# Patient Record
Sex: Female | Born: 1995 | Race: Black or African American | Hispanic: No | Marital: Single
Health system: Southern US, Community
[De-identification: ages and names within clinical notes are randomized; demographics above are authoritative.]

## PROBLEM LIST (undated history)

## (undated) DIAGNOSIS — Z800 Family history of malignant neoplasm of digestive organs: Secondary | ICD-10-CM

## (undated) DIAGNOSIS — Z789 Other specified health status: Secondary | ICD-10-CM

## (undated) DIAGNOSIS — Z803 Family history of malignant neoplasm of breast: Secondary | ICD-10-CM

## (undated) DIAGNOSIS — Z806 Family history of leukemia: Secondary | ICD-10-CM

## (undated) DIAGNOSIS — Z8042 Family history of malignant neoplasm of prostate: Secondary | ICD-10-CM

## (undated) HISTORY — DX: Family history of leukemia: Z80.6

## (undated) HISTORY — DX: Family history of malignant neoplasm of breast: Z80.3

## (undated) HISTORY — DX: Family history of malignant neoplasm of prostate: Z80.42

## (undated) HISTORY — DX: Family history of malignant neoplasm of digestive organs: Z80.0

---
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
## 2019-09-30 ENCOUNTER — Other Ambulatory Visit: Payer: Self-pay

## 2019-09-30 DIAGNOSIS — Z20822 Contact with and (suspected) exposure to covid-19: Secondary | ICD-10-CM

## 2019-10-02 LAB — NOVEL CORONAVIRUS, NAA: SARS-CoV-2, NAA: NOT DETECTED

## 2020-07-10 ENCOUNTER — Other Ambulatory Visit: Payer: Self-pay | Admitting: Sleep Medicine

## 2020-07-10 ENCOUNTER — Other Ambulatory Visit: Payer: Self-pay

## 2020-07-10 DIAGNOSIS — I471 Supraventricular tachycardia, unspecified: Secondary | ICD-10-CM

## 2020-07-11 LAB — NOVEL CORONAVIRUS, NAA: SARS-CoV-2, NAA: NOT DETECTED

## 2021-02-20 ENCOUNTER — Telehealth: Payer: Self-pay | Admitting: Genetic Counselor

## 2021-02-20 NOTE — Telephone Encounter (Signed)
[facility]. Pt has been cld and scheduled to see Cari on 4/25 at 1pm. Pt aware to arrive 15 minutes early.

## 2021-02-25 ENCOUNTER — Telehealth: Payer: Self-pay | Admitting: Genetic Counselor

## 2021-02-25 NOTE — Telephone Encounter (Signed)
Pt cld to reschedule her genetic counseling appt to 5/10 at 2pm.

## 2021-03-03 ENCOUNTER — Encounter: Payer: Self-pay | Admitting: Genetic Counselor

## 2021-03-03 ENCOUNTER — Other Ambulatory Visit: Payer: Self-pay

## 2021-03-18 ENCOUNTER — Other Ambulatory Visit: Payer: Self-pay

## 2021-03-18 ENCOUNTER — Inpatient Hospital Stay: Payer: Self-pay

## 2021-03-18 ENCOUNTER — Inpatient Hospital Stay: Payer: Self-pay | Attending: Genetic Counselor | Admitting: Genetic Counselor

## 2021-03-18 DIAGNOSIS — Z800 Family history of malignant neoplasm of digestive organs: Secondary | ICD-10-CM

## 2021-03-18 DIAGNOSIS — Z806 Family history of leukemia: Secondary | ICD-10-CM

## 2021-03-18 DIAGNOSIS — Z8042 Family history of malignant neoplasm of prostate: Secondary | ICD-10-CM

## 2021-03-18 DIAGNOSIS — Z803 Family history of malignant neoplasm of breast: Secondary | ICD-10-CM

## 2021-03-18 LAB — GENETIC SCREENING ORDER

## 2021-03-24 ENCOUNTER — Encounter: Payer: Self-pay | Admitting: Genetic Counselor

## 2021-03-24 DIAGNOSIS — Z800 Family history of malignant neoplasm of digestive organs: Secondary | ICD-10-CM | POA: Insufficient documentation

## 2021-03-24 DIAGNOSIS — Z803 Family history of malignant neoplasm of breast: Secondary | ICD-10-CM | POA: Insufficient documentation

## 2021-03-24 DIAGNOSIS — Z806 Family history of leukemia: Secondary | ICD-10-CM | POA: Insufficient documentation

## 2021-03-24 DIAGNOSIS — Z8042 Family history of malignant neoplasm of prostate: Secondary | ICD-10-CM | POA: Insufficient documentation

## 2021-03-24 NOTE — Progress Notes (Signed)
REFERRING PROVIDER: Posey Pronto, PA-C [place],  [place] [address]  PRIMARY PROVIDER:  No primary care provider on file.  PRIMARY REASON FOR VISIT:  1. Family history of prostate cancer in father   2. Family history of breast cancer   3. Family history of colon cancer   4. Family history of leukemia     HISTORY OF PRESENT ILLNESS:   Jessica Jordan, a 25 y.o. female, was seen for a [facility] cancer genetics consultation at the request of Dr. Patrina Levering due to a family history of cancer.  Jessica Jordan presents to clinic today to discuss the possibility of a hereditary predisposition to cancer, genetic testing, and to further clarify her future cancer risks, as well as potential cancer risks for family members.   Jessica Jordan does not have a personal history of cancer.    No past medical history on file.  Social History   Socioeconomic History  . Marital status: Single    Spouse name: Not on file  . Number of children: Not on file  . Years of education: Not on file  . Highest education level: Not on file  Occupational History  . Not on file  Tobacco Use  . Smoking status: Not on file  . Smokeless tobacco: Not on file  Substance and Sexual Activity  . Alcohol use: Not on file  . Drug use: Not on file  . Sexual activity: Not on file  Other Topics Concern  . Not on file  Social History Narrative   ** Merged History Encounter **       Social Determinants of Health   Financial Resource Strain: Not on file  Food Insecurity: Not on file  Transportation Needs: Not on file  Physical Activity: Not on file  Stress: Not on file  Social Connections: Not on file     FAMILY HISTORY:  We obtained a detailed, 4-generation family history.  Significant diagnoses are listed below: Family History  Problem Relation Age of Onset  . Prostate cancer Father 14  . Breast cancer Other 29       maternal great-aunt (MGM's sister)  . Cancer Paternal Uncle         unknown type, d. <50  . Colon cancer Other        dx <50, maternal great-uncle (MGM's brother)  . Colon cancer Other        maternal great-uncle (MGM's brother)  . Breast cancer Other        3 times, dx <50, paternal great-aunt (PGM's sister)  . Breast cancer Other        dx <50, paternal great-aunt (PGM's sister)  . Leukemia Other        paternal great-aunt (PGM's sister)  . Breast cancer Paternal Great-grandmother        (PGF's mother)  . Lung cancer Maternal Great-grandmother        (MGM's mother)   Jessica Jordan does not have children. She has one full-brother (age 51) and one paternal half-brother (age 50). Neither of these relatives have had cancer.  Jessica Jordan mother is alive at age 17 without cancer. There are three maternal aunts (all maternal half-sisters to her mother). There is no known cancer among maternal aunts or maternal cousins. Jessica Jordan maternal grandmother died at age 25 without cancer. Her maternal grandfather died younger than 67 without cancer. There was a maternal great-aunt with breast cancer at age 2, a great-uncle with colon cancer diagnosed younger  than 85, and a great-uncle with colon cancer (unknown age). Additionally, her maternal great-grandmother had lung cancer.  Jessica Jordan father is alive at age 3 with a prostate cancer diagnosed at age 25. There are six paternal aunts and four paternal uncles. Four of these aunts and two uncles were paternal half-siblings to her father. One half-uncle died from cancer younger than 44 (unknown type). There is no known cancer among paternal cousins. Jessica Jordan paternal grandmother died at age 23, and her paternal grandfather died in his 46s without cancer. One paternal great-aunt had breast cancer three times younger than 66, another great-aunt had breast cancer diagnosed younger than 34, and a third great-aunt had leukemia. Additionally, her paternal great-grandmother had breast cancer.  Jessica Jordan is unaware  of previous family history of genetic testing for hereditary cancer risks. Patient's ancestors are of unknown descent. There is no reported Ashkenazi Jewish ancestry. There is no known consanguinity.  GENETIC COUNSELING ASSESSMENT: Jessica Jordan is a 25 y.o. female with a family history of breast cancer, colon cancer, and leukemia which is somewhat suggestive of a hereditary cancer syndrome and predisposition to cancer. We, therefore, discussed and recommended the following at today's visit.   DISCUSSION: We discussed that approximately 5-10% of cancer is hereditary, with most cases of hereditary prostate and/or breast cancer associated with the BRCA1 and BRCA2 genes. There are other genes that can be associated with hereditary breast and/or prostate cancer syndromes. These include ATM, CHEK2, PALB2, etc. We discussed that testing is beneficial for several reasons, including knowing about other cancer risks, identifying potential screening and risk-reduction options that may be appropriate, and to understand if other family members could be at risk for cancer and allow them to undergo genetic testing.  We reviewed the characteristics, features and inheritance patterns of hereditary cancer syndromes. We also discussed genetic testing, including the appropriate family members to test, the process of testing, insurance coverage and turn-around-time for results. We discussed the implications of a negative, positive and/or variant of uncertain significant result. We recommended Jessica Jordan pursue genetic testing for the [facility] Common Hereditary Cancers + RNA panel.   The Common Hereditary Cancers Panel offered by [facility] includes sequencing and/or deletion duplication testing of the following 47genes: APC*, ATM*, AXIN2*, BARD1*, BMPR1A*, BRCA1*, BRCA2*, BRIP1*, CDH1*, CDK4, CDKN2A (p14ARF), CDKN2A (p16INK4a), CHEK2*, CTNNA1*, DICER1*, EPCAM (Deletion/duplication testing only), GREM1 (promoter region  deletion/duplication testing only), KIT, MEN1*, MLH1*, MSH2*, MSH3*, MSH6*, MUTYH*, NBN*, NF1*, NTHL1, PALB2*, PDGFRA, PMS2*, POLD1*, POLE*, PTEN*, RAD50*, RAD51C*, RAD51D*, SDHB*, SDHC*, SDHD*, SMAD4*, SMARCA4*, STK11*, TP53*, TSC1*, TSC2*, and VHL*.  The following genes were evaluated for sequence changes only: SDHA* and HOXB13 c.251G>A variant only. RNA analysis performed for * genes.   Based on Jessica Jordan's family history of cancer, she meets medical criteria for genetic testing. Despite that she meets criteria, there may still be an out of pocket cost. We discussed that if her out of pocket cost for testing is over $100, the laboratory will reach out to let her know. If the out of pocket cost of testing is less than $100 she will be billed by the genetic testing laboratory.   We discussed that some people do not want to undergo genetic testing due to fear of genetic discrimination. A federal law called the Genetic Information Non-Discrimination Act (GINA) of 2008 helps protect individuals against genetic discrimination based on their genetic test results. It impacts both health insurance and employment. With health insurance, it protects against increased premiums, being kicked  off insurance or being forced to take a test in order to be insured. For employment it protects against hiring, firing and promoting decisions based on genetic test results. Health status due to a cancer diagnosis is not protected under GINA. Additionally, life, disability, and long-term care insurance is not protected under GINA.  PLAN: After considering the risks, benefits, and limitations, Jessica Jordan provided informed consent to pursue genetic testing and the blood sample was sent to [facility] for analysis of the [facility] Common Hereditary Cancers + RNA panel. Results should be available within approximately two-three weeks' time, at which point they will be disclosed by telephone to Jessica Jordan, as will any  additional recommendations warranted by these results. Jessica Jordan will receive a summary of her genetic counseling visit and a copy of her results once available. This information will also be available in Epic.   Jessica Jordan questions were answered to her satisfaction today. Our contact information was provided should additional questions or concerns arise. Thank you for the referral and allowing [place] to share in the care of your patient.   Clint Guy, [place], [facility] Licensed, Certified Dispensing [email or web address] Phone: [phone]  The patient was seen for a total of 30 minutes in face-to-face genetic counseling.  This patient was discussed with Drs. Magrinat, Lindi Adie and/or Burr Medico who agrees with the above.    _______________________________________________________________________ For Office Staff:  Number of people involved in session: 1 Was an Intern/ student involved with case: no

## 2021-04-17 DIAGNOSIS — Z1379 Encounter for other screening for genetic and chromosomal anomalies: Secondary | ICD-10-CM | POA: Insufficient documentation

## 2021-04-18 ENCOUNTER — Encounter: Payer: Self-pay | Admitting: Genetic Counselor

## 2021-04-18 ENCOUNTER — Ambulatory Visit: Payer: Self-pay | Admitting: Genetic Counselor

## 2021-04-18 ENCOUNTER — Telehealth: Payer: Self-pay | Admitting: Genetic Counselor

## 2021-04-18 DIAGNOSIS — Z1379 Encounter for other screening for genetic and chromosomal anomalies: Secondary | ICD-10-CM

## 2021-04-18 NOTE — Progress Notes (Signed)
HPI:  Jessica Jordan was previously seen in the [facility] clinic due to a family history of cancer and concerns regarding a hereditary predisposition to cancer. Please refer to our prior cancer genetics clinic note for more information regarding our discussion, assessment and recommendations, at the time. Jessica Jordan recent genetic test results were disclosed to her, as were recommendations warranted by these results. These results and recommendations are discussed in more detail below.  FAMILY HISTORY:  We obtained a detailed, 4-generation family history.  Significant diagnoses are listed below: Family History  Problem Relation Age of Onset   Prostate cancer Father 7   Breast cancer Other 67       maternal great-aunt (MGM's sister)   Cancer Paternal Uncle        unknown type, d. <50   Colon cancer Other        dx <50, maternal great-uncle (MGM's brother)   Colon cancer Other        maternal great-uncle (MGM's brother)   Breast cancer Other        3 times, dx <50, paternal great-aunt (PGM's sister)   Breast cancer Other        dx <50, paternal great-aunt (PGM's sister)   Leukemia Other        paternal great-aunt (PGM's sister)   Breast cancer Paternal Great-grandmother        (PGF's mother)   Lung cancer Maternal Great-grandmother        (MGM's mother)   Jessica Jordan does not have children. She has one full-brother (age 43) and one paternal half-brother (age 32). Neither of these relatives have had cancer.   Jessica Jordan mother is alive at age 58 without cancer. There are three maternal aunts (all maternal half-sisters to her mother). There is no known cancer among maternal aunts or maternal cousins. Jessica Jordan maternal grandmother died at age 56 without cancer. Her maternal grandfather died younger than 8 without cancer. There was a maternal great-aunt with breast cancer at age 73, a great-uncle with colon cancer diagnosed younger than 64, and a great-uncle with  colon cancer (unknown age). Additionally, her maternal great-grandmother had lung cancer.   Jessica Jordan father is alive at age 40 with a prostate cancer diagnosed at age 48. There are six paternal aunts and four paternal uncles. Four of these aunts and two uncles were paternal half-siblings to her father. One half-uncle died from cancer younger than 63 (unknown type). There is no known cancer among paternal cousins. Jessica Jordan paternal grandmother died at age 46, and her paternal grandfather died in his 37s without cancer. One paternal great-aunt had breast cancer three times younger than 36, another great-aunt had breast cancer diagnosed younger than 32, and a third great-aunt had leukemia. Additionally, her paternal great-grandmother had breast cancer.   Jessica Jordan is unaware of previous family history of genetic testing for hereditary cancer risks. Patient's ancestors are of unknown descent. There is no reported Ashkenazi Jewish ancestry. There is no known consanguinity.  GENETIC TEST RESULTS: Genetic testing reported out on 04/17/2021 through the [facility] Common Hereditary Cancers panel. No pathogenic variants were detected.   The Common Hereditary Cancers Panel offered by [facility] includes sequencing and/or deletion duplication testing of the following 47genes: APC*, ATM*, AXIN2*, BARD1*, BMPR1A*, BRCA1*, BRCA2*, BRIP1*, CDH1*, CDK4, CDKN2A (p14ARF), CDKN2A (p16INK4a), CHEK2*, CTNNA1*, DICER1*, EPCAM (Deletion/duplication testing only), GREM1 (promoter region deletion/duplication testing only), KIT, MEN1*, MLH1*, MSH2*, MSH3*, MSH6*, MUTYH*, NBN*, NF1*, NTHL1, PALB2*, PDGFRA, PMS2*, POLD1*,  POLE*, PTEN*, RAD50*, RAD51C*, RAD51D*, SDHB*, SDHC*, SDHD*, SMAD4*, SMARCA4*, STK11*, TP53*, TSC1*, TSC2*, and VHL*.  The following genes were evaluated for sequence changes only: SDHA* and HOXB13 c.251G>A variant only. RNA analysis performed for * genes. The test report will be scanned into EPIC and located under  the Molecular Pathology section of the Results Review tab.  A portion of the Jordan report is included below for reference.     We discussed with Jessica Jordan that because current genetic testing is not perfect, it is possible there may be a gene mutation in one of these genes that current testing cannot detect, but that chance is small.  We also discussed that there could be another gene that has not yet been discovered, or that we have not yet tested, that is responsible for the cancer diagnoses in the family. It is also possible there is a hereditary cause for the cancer in the family that Jessica Jordan did not inherit and therefore was not identified in her testing.  Therefore, it is important to remain in touch with cancer genetics in the future so that we can continue to offer Jessica Jordan the most up to date genetic testing.   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 At this time, it is unknown if this variant is associated with increased cancer risk or if this is a normal finding, but most variants such as this get reclassified to being inconsequential. It should not be used to make medical management decisions. With time, we suspect the lab will determine the significance of this variant, if any. If we do learn more about it, we will try to contact Jessica Jordan to discuss it further. However, it is important to stay in touch with [place] periodically and keep the address and phone number up to date.  CANCER SCREENING RECOMMENDATIONS: Jessica Jordan is considered negative (normal).  This means that we have not identified a hereditary cause for her family history of cancer at this time. While reassuring, this does not definitively rule out a hereditary predisposition to cancer. It is still possible that there could be genetic mutations that are undetectable by current technology. There could be genetic mutations in  genes that have not been tested or identified to increase cancer risk. Therefore, it is recommended she continue to follow the cancer management and screening guidelines provided by her primary healthcare provider.   An individual's cancer risk and medical management are not determined by genetic test results alone. Overall cancer risk assessment incorporates additional factors, including personal medical history, family history, and any available genetic information that may Jordan in a personalized plan for cancer prevention and surveillance.  Breast Cancer Risk: We reviewed the use of breast cancer risk model Tyrer-Cuzick to estimate her lifetime risk of developing breast cancer. The American Cancer Society (ACS) recommends consideration of breast MRI screening as an adjunct to mammography for patients at high risk (defined as 20% or greater lifetime risk). Jessica Jordan. Seyer did not feel comfortable answering the personal history questions during our phone call because she was at work. She will plan to call us back to complete this risk estimate and better determine appropriate breast cancer screening options.   RECOMMENDATIONS FOR FAMILY MEMBERS:  Individuals in this family might be at some increased risk of developing cancer, over the general population risk, simply due to the family history of cancer.  We recommended women in this family have  a yearly mammogram beginning at age 69, or 58 years younger than the earliest onset of cancer, an annual clinical breast exam, and perform monthly breast self-exams. Women in this family should also have a gynecological exam as recommended by their primary provider. All family members should be referred for colonoscopy starting at age 7.  It is also possible there is a hereditary cause for the cancer in Jessica Jordan. Enck's family that she did not inherit and therefore was not identified in her.  Based on Jessica Jordan. Mazzuca's family history, we recommended her father, who was  diagnosed with prostate cancer at age 21, have genetic counseling and testing. Jessica Jordan. Prentiss will let us know if we can be of any assistance in coordinating genetic counseling and/or testing for this family member.   FOLLOW-UP: Lastly, we discussed with Jessica Jordan. Reising that cancer genetics is a rapidly advancing field and it is possible that new genetic tests will be appropriate for her and/or her family members in the future. We encouraged her to remain in contact with cancer genetics on an annual basis so we can update her personal and family histories and let her know of advances in cancer genetics that may benefit this family.   Our contact number was provided. Jessica Jordan. [place] questions were answered to her satisfaction, and she knows she is welcome to call us at anytime with additional questions or concerns.   Clint Guy, Jessica Jordan, [facility] Genetic Counselor [email or web address] Phone: [phone]

## 2021-04-18 NOTE — Telephone Encounter (Signed)
Revealed negative genetic testing. Discussed that we do not know why there is cancer in the family. There could be a genetic mutation in the family that Jessica Jordan did not inherit. There could also be a mutation in a different gene that we are not testing, or our current technology may not be able to detect certain mutations. It will therefore be important for her to stay in contact with genetics to keep up with whether additional testing may be appropriate in the future.   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 Her result is still considered normal at this time and should not impact her medical management.

## 2021-11-09 NOTE — L&D Delivery Note (Signed)
Delivery Note ?Pt had a fairly long latent labor.  At around 2000, she requested to turn pitocin off.  When the nurse called me, I said I did not recommend that, but that it was up to the patient.  VE at that time was 4-5 cm.  Pitocin was turned off from 2000 to 0300.  She received an epidural around 0200, and agreed to restart pitocin once she was comfortable.  VE before starting pitocin was unchanged.  She then progressed rapidly to complete and pushed well.  At 5:03 AM a viable female was delivered via Vaginal, Spontaneous (Presentation:  vtx, ROA).  APGAR: 9, 9; weight pending.   ?Placenta status: Spontaneous, Intact.  Cord:   with the following complications: none ? ?Anesthesia: Epidural ?Episiotomy: None ?Lacerations: 1st degree vaginal, bilateral labial-repair on right for hemostasis ?Suture Repair: 3.0 vicryl rapide ?Est. Blood Loss (mL): 100 ? ?Mom to postpartum.  Baby to Couplet care / Skin to Skin. ? ?Leighton Roach Ton[place]uh Mallon ?03/17/2022, 5:23 AM ? ? ? ?

## 2021-11-09 NOTE — L&D Delivery Note (Signed)
Patient is contemplating getting an epidural before checking her cervix and restarting pitocin. Will give patient a moment to make up her mind and check on her shortly. ?

## 2021-11-27 ENCOUNTER — Telehealth: Payer: Self-pay | Admitting: Family Medicine

## 2021-11-27 NOTE — Telephone Encounter (Signed)
Called patient on the phone number listed under mobile and the phone number listed on the [place] referral, there was no answer to either call so voicemails were left with the call back number for the office.

## 2022-03-10 LAB — OB RESULTS CONSOLE GBS: GBS: POSITIVE

## 2022-03-15 ENCOUNTER — Inpatient Hospital Stay (HOSPITAL_COMMUNITY)
Admission: AD | Admit: 2022-03-15 | Discharge: 2022-03-18 | DRG: 806 | Disposition: A | Discharge status: Home / Self Care | Payer: No Typology Code available for payment source | Attending: Obstetrics | Admitting: Obstetrics

## 2022-03-15 ENCOUNTER — Encounter (HOSPITAL_COMMUNITY): Payer: Self-pay | Admitting: Obstetrics

## 2022-03-15 ENCOUNTER — Other Ambulatory Visit: Payer: Self-pay

## 2022-03-15 DIAGNOSIS — O4292 Full-term premature rupture of membranes, unspecified as to length of time between rupture and onset of labor: Principal | ICD-10-CM | POA: Diagnosis present

## 2022-03-15 DIAGNOSIS — O99824 Streptococcus B carrier state complicating childbirth: Secondary | ICD-10-CM | POA: Diagnosis present

## 2022-03-15 DIAGNOSIS — Z3A37 37 weeks gestation of pregnancy: Secondary | ICD-10-CM | POA: Diagnosis not present

## 2022-03-15 DIAGNOSIS — A6000 Herpesviral infection of urogenital system, unspecified: Secondary | ICD-10-CM | POA: Diagnosis present

## 2022-03-15 DIAGNOSIS — O4290 Premature rupture of membranes, unspecified as to length of time between rupture and onset of labor, unspecified weeks of gestation: Principal | ICD-10-CM | POA: Diagnosis present

## 2022-03-15 DIAGNOSIS — Z87891 Personal history of nicotine dependence: Secondary | ICD-10-CM | POA: Diagnosis not present

## 2022-03-15 DIAGNOSIS — O9832 Other infections with a predominantly sexual mode of transmission complicating childbirth: Secondary | ICD-10-CM | POA: Diagnosis present

## 2022-03-15 DIAGNOSIS — O26893 Other specified pregnancy related conditions, third trimester: Secondary | ICD-10-CM | POA: Diagnosis present

## 2022-03-15 HISTORY — DX: Other specified health status: Z78.9

## 2022-03-15 LAB — CBC
HCT: 33.7 % — ABNORMAL LOW (ref 36.0–46.0)
Hemoglobin: 11.4 g/dL — ABNORMAL LOW (ref 12.0–15.0)
MCH: 30.5 pg (ref 26.0–34.0)
MCHC: 33.8 g/dL (ref 30.0–36.0)
MCV: 90.1 fL (ref 80.0–100.0)
Platelets: 256 10*3/uL (ref 150–400)
RBC: 3.74 MIL/uL — ABNORMAL LOW (ref 3.87–5.11)
RDW: 14.6 % (ref 11.5–15.5)
WBC: 12.3 10*3/uL — ABNORMAL HIGH (ref 4.0–10.5)
nRBC: 0 % (ref 0.0–0.2)

## 2022-03-15 LAB — POCT FERN TEST

## 2022-03-15 MED ORDER — LACTATED RINGERS IV SOLN
500.0000 mL | INTRAVENOUS | Status: DC | PRN
  Administered 2022-03-16: 500 mL via INTRAVENOUS

## 2022-03-15 MED ORDER — OXYCODONE-ACETAMINOPHEN 5-325 MG PO TABS: 2.0000 | ORAL_TABLET | ORAL | Status: DC | PRN

## 2022-03-15 MED ORDER — OXYTOCIN-SODIUM CHLORIDE 30-0.9 UT/500ML-% IV SOLN
2.5000 [IU]/h | INTRAVENOUS | Status: DC
  Administered 2022-03-17: 2.5 [IU]/h via INTRAVENOUS
  Filled 2022-03-15: qty 500

## 2022-03-15 MED ORDER — LACTATED RINGERS IV SOLN: INTRAVENOUS | Status: DC

## 2022-03-15 MED ORDER — ACETAMINOPHEN 325 MG PO TABS: 650.0000 mg | ORAL_TABLET | ORAL | Status: DC | PRN

## 2022-03-15 MED ORDER — SODIUM CHLORIDE 0.9 % IV SOLN
5.0000 10*6.[IU] | Freq: Once | INTRAVENOUS | Status: AC
  Administered 2022-03-16: 5 10*6.[IU] via INTRAVENOUS
  Filled 2022-03-15: qty 5

## 2022-03-15 MED ORDER — PENICILLIN G POT IN DEXTROSE 60000 UNIT/ML IV SOLN
3.0000 10*6.[IU] | INTRAVENOUS | Status: DC
  Administered 2022-03-16 – 2022-03-17 (×4): 3 10*6.[IU] via INTRAVENOUS
  Filled 2022-03-15 (×4): qty 50

## 2022-03-15 MED ORDER — ONDANSETRON HCL 4 MG/2ML IJ SOLN
4.0000 mg | Freq: Four times a day (QID) | INTRAMUSCULAR | Status: DC | PRN
  Administered 2022-03-17: 4 mg via INTRAVENOUS
  Filled 2022-03-15: qty 2

## 2022-03-15 MED ORDER — OXYTOCIN BOLUS FROM INFUSION
333.0000 mL | Freq: Once | INTRAVENOUS | Status: AC
  Administered 2022-03-17: 333 mL via INTRAVENOUS

## 2022-03-15 MED ORDER — FENTANYL CITRATE (PF) 100 MCG/2ML IJ SOLN
50.0000 ug | INTRAMUSCULAR | Status: DC | PRN
  Administered 2022-03-16 (×5): 100 ug via INTRAVENOUS
  Administered 2022-03-16: 50 ug via INTRAVENOUS
  Administered 2022-03-16 (×2): 100 ug via INTRAVENOUS
  Filled 2022-03-15 (×10): qty 2

## 2022-03-15 MED ORDER — LIDOCAINE HCL (PF) 1 % IJ SOLN: 30.0000 mL | INTRAMUSCULAR | Status: DC | PRN

## 2022-03-15 MED ORDER — SOD CITRATE-CITRIC ACID 500-334 MG/5ML PO SOLN: 30.0000 mL | ORAL | Status: DC | PRN

## 2022-03-15 MED ORDER — OXYCODONE-ACETAMINOPHEN 5-325 MG PO TABS: 1.0000 | ORAL_TABLET | ORAL | Status: DC | PRN

## 2022-03-15 NOTE — H&P (Signed)
27 y.o. G1P0 @ [redacted]w[redacted]d presents with complaints of leakage of clear fluid approximately 6 hours ago at 1730.  She reports contractions q5 minutes.  Otherwise has good fetal movement and no bleeding.  Was 1 cm on admission and was called 2-3 cm by RN at 0200.  Contractions have spaced to about q5-6 minutes ? ?Pregnancy complicated by: ?Genital herpes. On prophylactic valtrex.  Denies current lesions or prodromal symptoms ?H/o PTSD.  Not on any medication during this pregnancy ? ?Past Medical History:  ?Diagnosis Date  ? Family history of breast cancer   ? Family history of colon cancer   ? Family history of leukemia   ? Family history of prostate cancer in father   ? Medical history non-contributory   ? History reviewed. No pertinent surgical history.  ?OB History  ?Gravida Para Term Preterm AB Living  ?1            ?SAB IAB Ectopic Multiple Live Births  ?           ?  ?# Outcome Date GA Lbr Len/2nd Weight Sex Delivery Anes PTL Lv  ?1 Current           ?  ?Social History  ? ?Socioeconomic History  ? Marital status: Single  ?  Spouse name: Not on file  ? Number of children: Not on file  ? Years of education: Not on file  ? Highest education level: Not on file  ?Occupational History  ? Not on file  ?Tobacco Use  ? Smoking status: Former  ?  Types: Cigarettes  ? Smokeless tobacco: Never  ?Substance and Sexual Activity  ? Alcohol use: Not Currently  ? Drug use: Yes  ?  Types: Other-see comments  ? Sexual activity: Yes  ?Other Topics Concern  ? Not on file  ?Social History Narrative  ? ** Merged History Encounter **  ?    ? ?Social Determinants of Health  ? ?Financial Resource Strain: Not on file  ?Food Insecurity: Not on file  ?Transportation Needs: Not on file  ?Physical Activity: Not on file  ?Stress: Not on file  ?Social Connections: Not on file  ?Intimate Partner Violence: Not on file  ? Patient has no known allergies.  ? ? ?Prenatal Transfer Tool  ?Maternal Diabetes: No ?Genetic Screening: Normal ?Maternal  Ultrasounds/Referrals: Normal ?Fetal Ultrasounds or other Referrals:  None ?Maternal Substance Abuse:  No ?Significant Maternal Medications:  Meds include: Other:  valtrex ?Significant Maternal Lab Results: Group B Strep positive ? ?ABO, Rh: --/--/O POS (05/07 2300) ?Antibody: NEG (05/07 2300) ?Rubella:  Immune ?RPR:   NR ?HBsAg:   Negative ?HIV:   Negative ?GBS:   Positive ? ? ? ?Vitals:  ? 03/16/22 0207 03/16/22 0453  ?BP: 135/85 122/86  ?Pulse: 80 86  ?Resp: 18   ?Temp: 97.8 ?F (36.6 ?C)   ?  ? ?General:  NAD ?Abdomen:  soft, gravid, EFW 6# ?Ex:  no edema  ?Vulva/vagina/perineum clear of lesions ?SVE:  2/70-2  ?FHTs:  130s moderate variability  ?Toco:  q5-6 minutes ? ? ?A/P   26 y.o. G1P0 [redacted]w[redacted]d presents with rupture of membranes ?Latent labor.  Contractions spacing slightly.  I recommend starting pitocin at this time.  She was hoping to avoid an epidural so is worried about increasing pain. She asks to wait to start pitocin She is amenable to recheck in 1-2 hours and if unchanged thinks she will agree to start pitocin at that time.  ?History of genital herpes,  on valtrex no visible lesions or prodromal syndromes ?GBS positive: PCN ? ? ? ?[place] ? ?

## 2022-03-15 NOTE — MAU Note (Signed)
.  Jessica Jordan is a 26 y.o. at [redacted]w[redacted]d here in MAU reporting: water broke about 1730 clear fluid. Has had leaking on and off ever since. Also c/o ctx q 5 min. Good fetal movement felt. ? ?Onset of complaint: 1730 ?Pain score: 7 ?Vitals:  ? 03/15/22 2220  ?BP: 137/83  ?Pulse: 97  ?Resp: 18  ?Temp: 98.8 ?F (37.1 ?C)  ?   ?FHT:135 ?Lab orders placed from triage:   ? ?

## 2022-03-16 ENCOUNTER — Encounter (HOSPITAL_COMMUNITY): Payer: Self-pay | Admitting: Obstetrics

## 2022-03-16 LAB — RPR: RPR Ser Ql: NONREACTIVE

## 2022-03-16 LAB — TYPE AND SCREEN
ABO/RH(D): O POS
Antibody Screen: NEGATIVE

## 2022-03-16 MED ORDER — LORATADINE 10 MG PO TABS
10.0000 mg | ORAL_TABLET | Freq: Every day | ORAL | Status: DC
  Administered 2022-03-16: 10 mg via ORAL
  Filled 2022-03-16 (×2): qty 1

## 2022-03-16 MED ORDER — EPHEDRINE 5 MG/ML INJ: 10.0000 mg | INTRAVENOUS | Status: DC | PRN

## 2022-03-16 MED ORDER — PHENYLEPHRINE 80 MCG/ML (10ML) SYRINGE FOR IV PUSH (FOR BLOOD PRESSURE SUPPORT)
80.0000 ug | PREFILLED_SYRINGE | INTRAVENOUS | Status: AC | PRN
  Administered 2022-03-17 (×3): 80 ug via INTRAVENOUS

## 2022-03-16 MED ORDER — FENTANYL-BUPIVACAINE-NACL 0.5-0.125-0.9 MG/250ML-% EP SOLN
12.0000 mL/h | EPIDURAL | Status: DC | PRN
  Administered 2022-03-17: 12 mL/h via EPIDURAL
  Filled 2022-03-16: qty 250

## 2022-03-16 MED ORDER — DIPHENHYDRAMINE HCL 50 MG/ML IJ SOLN: 12.5000 mg | INTRAMUSCULAR | Status: DC | PRN

## 2022-03-16 MED ORDER — PHENYLEPHRINE 80 MCG/ML (10ML) SYRINGE FOR IV PUSH (FOR BLOOD PRESSURE SUPPORT)
80.0000 ug | PREFILLED_SYRINGE | INTRAVENOUS | Status: DC | PRN
  Filled 2022-03-16: qty 10

## 2022-03-16 MED ORDER — LACTATED RINGERS IV SOLN
500.0000 mL | Freq: Once | INTRAVENOUS | Status: AC
  Administered 2022-03-17: 500 mL via INTRAVENOUS

## 2022-03-16 MED ORDER — TERBUTALINE SULFATE 1 MG/ML IJ SOLN: 0.2500 mg | Freq: Once | INTRAMUSCULAR | Status: DC | PRN

## 2022-03-16 MED ORDER — OXYTOCIN-SODIUM CHLORIDE 30-0.9 UT/500ML-% IV SOLN
1.0000 m[IU]/min | INTRAVENOUS | Status: DC
  Administered 2022-03-16: 2 m[IU]/min via INTRAVENOUS
  Administered 2022-03-17: 10 m[IU]/min via INTRAVENOUS
  Filled 2022-03-16: qty 500

## 2022-03-16 NOTE — Progress Notes (Signed)
Feeling ctx, tried NO2, now IV meds ?Afeb, VSS ?FHT-120s, mod variability, some early and small variable decels, Cat II, ctx q 2-3 min on 16 mu/min pitocin ?VE-3/80/-2 per RN at 1523 ? ?Will continue pitocin and PCN, currently in latent labor, monitor progress, anticipate SVD ?

## 2022-03-16 NOTE — Progress Notes (Signed)
Feeling some ctx ?Afeb, VSS ?FHT-120-130, Cat I, irreg ctx not always tracing well, Cat I ?VE-deferred ? ?Discussed that recommendation is for augmentation of labor with SROM, especially with +GBS.  I have recommended pitocin start now, questions answered.  She will consider pitocin in another hour or so.  Continue PCN for +GBS ?

## 2022-03-16 NOTE — Anesthesia Preprocedure Evaluation (Addendum)
Anesthesia Evaluation  ?Patient identified by MRN, date of birth, ID band ?Patient awake ? ? ? ?Reviewed: ?Allergy & Precautions, NPO status , Patient's Chart, lab work & pertinent test results ? ?History of Anesthesia Complications ?Negative for: history of anesthetic complications ? ?Airway ?Mallampati: II ? ?TM Distance: >3 FB ?Neck ROM: Full ? ? ? Dental ?no notable dental hx. ?(+) Dental Advisory Given ?  ?Pulmonary ?neg pulmonary ROS, former smoker,  ?  ?Pulmonary exam normal ? ? ? ? ? ? ? Cardiovascular ?negative cardio ROS ?Normal cardiovascular exam ? ? ?  ?Neuro/Psych ?negative neurological ROS ?   ? GI/Hepatic ?negative GI ROS, Neg liver ROS,   ?Endo/Other  ?negative endocrine ROS ? Renal/GU ?negative Renal ROS  ? ?  ?Musculoskeletal ?negative musculoskeletal ROS ?(+)  ? Abdominal ?  ?Peds ? Hematology ? ?(+) Blood dyscrasia, anemia ,   ?Anesthesia Other Findings ? ? Reproductive/Obstetrics ? ?  ? ? ? ? ? ? ? ? ? ? ? ? ? ?  ?  ? ? ? ? ? ? ? ?Anesthesia Physical ?Anesthesia Plan ? ?ASA: 2 ? ?Anesthesia Plan: Epidural  ? ?Post-op Pain Management:   ? ?Induction:  ? ?PONV Risk Score and Plan:  ? ?Airway Management Planned:  ? ?Additional Equipment:  ? ?Intra-op Plan:  ? ?Post-operative Plan:  ? ?Informed Consent: I have reviewed the patients History and Physical, chart, labs and discussed the procedure including the risks, benefits and alternatives for the proposed anesthesia with the patient or authorized representative who has indicated his/her understanding and acceptance.  ? ? ? ?Dental advisory given ? ?Plan Discussed with: Anesthesiologist ? ?Anesthesia Plan Comments:   ? ? ? ? ? ?Anesthesia Quick Evaluation ? ?

## 2022-03-17 ENCOUNTER — Inpatient Hospital Stay (HOSPITAL_COMMUNITY): Payer: No Typology Code available for payment source | Admitting: Anesthesiology

## 2022-03-17 ENCOUNTER — Encounter (HOSPITAL_COMMUNITY): Payer: Self-pay | Admitting: Obstetrics

## 2022-03-17 MED ORDER — METHYLERGONOVINE MALEATE 0.2 MG PO TABS: 0.2000 mg | ORAL_TABLET | ORAL | Status: DC | PRN

## 2022-03-17 MED ORDER — PRENATAL MULTIVITAMIN CH
1.0000 | ORAL_TABLET | Freq: Every day | ORAL | Status: DC
  Administered 2022-03-17 – 2022-03-18 (×2): 1 via ORAL
  Filled 2022-03-17 (×2): qty 1

## 2022-03-17 MED ORDER — BENZOCAINE-MENTHOL 20-0.5 % EX AERO
1.0000 "application " | INHALATION_SPRAY | CUTANEOUS | Status: DC | PRN
  Filled 2022-03-17: qty 56

## 2022-03-17 MED ORDER — IBUPROFEN 600 MG PO TABS
600.0000 mg | ORAL_TABLET | Freq: Four times a day (QID) | ORAL | Status: DC
  Administered 2022-03-17 – 2022-03-18 (×6): 600 mg via ORAL
  Filled 2022-03-17 (×6): qty 1

## 2022-03-17 MED ORDER — SIMETHICONE 80 MG PO CHEW: 80.0000 mg | CHEWABLE_TABLET | ORAL | Status: DC | PRN

## 2022-03-17 MED ORDER — MEASLES, MUMPS & RUBELLA VAC IJ SOLR: 0.5000 mL | Freq: Once | INTRAMUSCULAR | Status: DC

## 2022-03-17 MED ORDER — ONDANSETRON HCL 4 MG/2ML IJ SOLN: 4.0000 mg | INTRAMUSCULAR | Status: DC | PRN

## 2022-03-17 MED ORDER — WITCH HAZEL-GLYCERIN EX PADS: 1.0000 "application " | MEDICATED_PAD | CUTANEOUS | Status: DC | PRN

## 2022-03-17 MED ORDER — LIDOCAINE HCL (PF) 1 % IJ SOLN
INTRAMUSCULAR | Status: DC | PRN
  Administered 2022-03-17 (×2): 5 mL via EPIDURAL

## 2022-03-17 MED ORDER — ZOLPIDEM TARTRATE 5 MG PO TABS: 5.0000 mg | ORAL_TABLET | Freq: Every evening | ORAL | Status: DC | PRN

## 2022-03-17 MED ORDER — OXYCODONE HCL 5 MG PO TABS
5.0000 mg | ORAL_TABLET | ORAL | Status: DC | PRN
  Administered 2022-03-18: 5 mg via ORAL
  Filled 2022-03-17: qty 1

## 2022-03-17 MED ORDER — MAGNESIUM HYDROXIDE 400 MG/5ML PO SUSP: 30.0000 mL | ORAL | Status: DC | PRN

## 2022-03-17 MED ORDER — DIBUCAINE (PERIANAL) 1 % EX OINT: 1.0000 "application " | TOPICAL_OINTMENT | CUTANEOUS | Status: DC | PRN

## 2022-03-17 MED ORDER — OXYCODONE HCL 5 MG PO TABS: 10.0000 mg | ORAL_TABLET | ORAL | Status: DC | PRN

## 2022-03-17 MED ORDER — DIPHENHYDRAMINE HCL 25 MG PO CAPS: 25.0000 mg | ORAL_CAPSULE | Freq: Four times a day (QID) | ORAL | Status: DC | PRN

## 2022-03-17 MED ORDER — ACETAMINOPHEN 325 MG PO TABS
650.0000 mg | ORAL_TABLET | ORAL | Status: DC | PRN
  Administered 2022-03-17 – 2022-03-18 (×2): 650 mg via ORAL
  Filled 2022-03-17 (×2): qty 2

## 2022-03-17 MED ORDER — FENTANYL CITRATE (PF) 100 MCG/2ML IJ SOLN
INTRAMUSCULAR | Status: DC | PRN
Start: 2022-03-17 — End: 2022-03-17
  Administered 2022-03-17 (×2): 50 ug via EPIDURAL

## 2022-03-17 MED ORDER — METHYLERGONOVINE MALEATE 0.2 MG/ML IJ SOLN: 0.2000 mg | INTRAMUSCULAR | Status: DC | PRN

## 2022-03-17 MED ORDER — COCONUT OIL OIL: 1.0000 "application " | TOPICAL_OIL | Status: DC | PRN

## 2022-03-17 MED ORDER — SENNOSIDES-DOCUSATE SODIUM 8.6-50 MG PO TABS
2.0000 | ORAL_TABLET | Freq: Every day | ORAL | Status: DC
  Administered 2022-03-18: 2 via ORAL
  Filled 2022-03-17: qty 2

## 2022-03-17 MED ORDER — BUPIVACAINE HCL (PF) 0.25 % IJ SOLN
INTRAMUSCULAR | Status: DC | PRN
  Administered 2022-03-17 (×2): 5 mL via EPIDURAL

## 2022-03-17 MED ORDER — TETANUS-DIPHTH-ACELL PERTUSSIS 5-2.5-18.5 LF-MCG/0.5 IM SUSY: 0.5000 mL | PREFILLED_SYRINGE | Freq: Once | INTRAMUSCULAR | Status: DC

## 2022-03-17 MED ORDER — ONDANSETRON HCL 4 MG PO TABS: 4.0000 mg | ORAL_TABLET | ORAL | Status: DC | PRN

## 2022-03-17 NOTE — Progress Notes (Signed)
Patient told not to get up without RN. Patient instructed on feeding infant on cues. Patient may want to breast and bottle I told her to let me know what she wants to do.  ?

## 2022-03-17 NOTE — Progress Notes (Signed)
Patient told to call for Motrin when she is ready for it. ?

## 2022-03-17 NOTE — Anesthesia Postprocedure Evaluation (Signed)
Anesthesia Post Note ? ?Patient: Jessica Jordan ? ?Procedure(s) Performed: AN AD HOC LABOR EPIDURAL ? ?  ? ?Patient location during evaluation: Mother Baby ?Anesthesia Type: Epidural ?Level of consciousness: awake, awake and alert and oriented ?Pain management: pain level controlled ?Vital Signs Assessment: post-procedure vital signs reviewed and stable ?Respiratory status: spontaneous breathing and respiratory function stable ?Cardiovascular status: blood pressure returned to baseline ?Postop Assessment: no headache, epidural receding, patient able to bend at knees, no backache, no apparent nausea or vomiting, able to ambulate and adequate PO intake ?Anesthetic complications: no ? ? ?No notable events documented. ? ?Last Vitals:  ?Vitals:  ? 03/17/22 0915 03/17/22 1233  ?BP: 120/73 126/82  ?Pulse: 65 70  ?Resp: 18 18  ?Temp: 36.8 ?C 36.8 ?C  ?SpO2:    ?  ?Last Pain:  ?Vitals:  ? 03/17/22 1233  ?TempSrc: Oral  ?PainSc:   ? ?Pain Goal:   ? ?  ?  ?  ?  ?  ?  ?  ? ?Evelina Dun R ? ? ? ? ?
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
## 2022-03-17 NOTE — Plan of Care (Signed)

## 2022-03-17 NOTE — Anesthesia Procedure Notes (Signed)
Epidural ?Patient location during procedure: OB ?Start time: 03/17/2022 2:21 AM ?End time: 03/17/2022 2:36 AM ? ?Staffing ?Anesthesiologist: Duane Boston, MD ?Performed: anesthesiologist  ? ?Preanesthetic Checklist ?Completed: patient identified, IV checked, site marked, risks and benefits discussed, monitors and equipment checked, pre-op evaluation and timeout performed ? ?Epidural ?Patient position: sitting ?Prep: DuraPrep ?Patient monitoring: heart rate, cardiac monitor, continuous pulse ox and blood pressure ?Approach: midline ?Location: L2-L3 ?Injection technique: LOR saline ? ?Needle:  ?Needle type: Tuohy  ?Needle gauge: 17 G ?Needle length: 9 cm ?Needle insertion depth: 5 cm ?Catheter size: 20 Guage ?Catheter at skin depth: 10 cm ?Test dose: negative and Other ? ?Assessment ?Events: blood not aspirated, injection not painful, no injection resistance and negative IV test ? ?Additional Notes ?Informed consent obtained prior to proceeding including risk of failure, 1% risk of PDPH, risk of minor discomfort and bruising.  Discussed rare but serious complications including epidural abscess, permanent nerve injury, epidural hematoma.  Discussed alternatives to epidural analgesia and patient desires to proceed.  Timeout performed pre-procedure verifying patient name, procedure, and platelet count.  Patient tolerated procedure well. ? ? ? ? ?

## 2022-03-17 NOTE — Lactation Note (Signed)
This note was copied from a baby's chart. ?Lactation Consultation Note ? ?Patient Name: Jessica Jordan ?Today's Date: 03/17/2022 ?Reason for consult: Initial assessment;1st time breastfeeding;Early term 37-38.6wks;Infant < 6lbs ?Age:26 hours ? ?P1, Baby 38 weeks < 6 lbs and has been sleepy. ?Reviewed hand expression and gave baby drops on spoon. ?Attempted latching but baby is too sleepy. ?Set up DEBP.  Visitors arrived during consult. ?Mother will pump after visitors leave for 15 min. ?Depending on volume pumped, mother may choose to supplement.  Provided options. ?Feed on demand with cues.  Goal 8-12+ times per day after first 24 hrs.  Place baby STS if not cueing.  ?Mom made aware of O/P services, breastfeeding support groups, community resources, and our phone # for post-discharge questions.  ? ?Maternal Data ?Has patient been taught Hand Expression?: Yes ? ?Feeding ?Mother's Current Feeding Choice: Breast Milk ? ? ?Lactation Tools Discussed/Used ?Tools: Pump ?Breast pump type: Double-Electric Breast Pump ?Pump Education: Setup, frequency, and cleaning;Milk Storage ?Reason for Pumping: stimulation and supplementation ?Pumping frequency: Recommend q 3 hours ? ?Interventions ? educaton ? ?Discharge ?Pump: Personal;Hands Free [name]) ? ?Consult Status ?Consult Status: Follow-up ?Date: 03/17/22 ?Follow-up type: In-patient ? ? ? ?Vivianne Master Boschen ?03/17/2022, 1:04 PM ? ? ? ?

## 2022-03-18 LAB — CBC
HCT: 31.7 % — ABNORMAL LOW (ref 36.0–46.0)
Hemoglobin: 10.3 g/dL — ABNORMAL LOW (ref 12.0–15.0)
MCH: 29.4 pg (ref 26.0–34.0)
MCHC: 32.5 g/dL (ref 30.0–36.0)
MCV: 90.6 fL (ref 80.0–100.0)
Platelets: 214 10*3/uL (ref 150–400)
RBC: 3.5 MIL/uL — ABNORMAL LOW (ref 3.87–5.11)
RDW: 15 % (ref 11.5–15.5)
WBC: 13.6 10*3/uL — ABNORMAL HIGH (ref 4.0–10.5)
nRBC: 0 % (ref 0.0–0.2)

## 2022-03-18 MED ORDER — IBUPROFEN 600 MG PO TABS: 600.0000 mg | ORAL_TABLET | Freq: Four times a day (QID) | ORAL | 0 refills | Status: AC

## 2022-03-18 MED ORDER — ACETAMINOPHEN 325 MG PO TABS: 650.0000 mg | ORAL_TABLET | ORAL | 0 refills | Status: AC | PRN

## 2022-03-18 NOTE — Progress Notes (Signed)
Patient ID: Jessica Jordan, female   DOB: October 11, 1996, 26 y.o.   MRN: [id number] ?Pediatrician decided baby can go today and pt wishes d/c, will d/c to home with routine f/u ?

## 2022-03-18 NOTE — Lactation Note (Signed)
This note was copied from a baby's chart. ?Lactation Consultation Note ? ?Patient Name: Jessica Jordan ?Today's Date: 03/18/2022 ?Reason for consult: Follow-up assessment;Early term 37-38.6wks;Infant < 6lbs;Nipple pain/trauma;Breastfeeding assistance ?Age:26 hours ? ?P1, Early Term, Female Infant ? ?Baby was sleeping in the bassinet when LC entered the room. Mom states that breastfeeding is "going" and that she has gotten baby to latch once today and made an attempt at 10:25 that was not successful.  ? ?Mom states that she needs to do better with pumping, but she is very tired. LC let mom know that getting rest is important and pumping to protect her milk supply can help her with meeting her breastfeeding goals. LC encouraged mom to pump frequently and hand express for more stimulation.  ? ?Mom states that she has some nipple pain. LC assessed the tissue and did not note any visible nipple damage. LC informed mom that she could use her expressed milk or coconut oil for nipple pain and to help with pain with pumping.  ? ?Mom stated that she had no further questions and will call lactation for further assistance when baby is ready to feed.  ? ?Interventions ?Interventions: Breast feeding basics reviewed;Hand express;Education ? ?Discharge ?  ? ?Consult Status ?Consult Status: Follow-up ?Date: 03/19/22 ?Follow-up type: In-patient ? ? ? ?Kazumi Lachney P Henry, IBCLC ?03/18/2022, 11:26 AM ? ? ? ?

## 2022-03-18 NOTE — Progress Notes (Signed)
Post Partum Day 1 ?Subjective: ?no complaints, up ad lib, and tolerating PO  Breast and bottlefeeding ? ?Objective: ?Blood pressure 118/73, pulse 66, temperature 97.9 ?F (36.6 ?C), temperature source Oral, resp. rate 18, height 5\' 7"  (1.702 m), weight 83 kg, SpO2 100 %, unknown if currently breastfeeding. ? ?Physical Exam:  ?General: alert and cooperative ?Lochia: appropriate ?Uterine Fundus: firm ? ? ?Recent Labs  ?  03/15/22 ?2300 03/18/22 ?0559  ?HGB 11.4* 10.3*  ?HCT 33.7* 31.7*  ? ? ?Assessment/Plan: ?Plan for discharge tomorrow ?Baby doing well but given less than 6#, peds will likely wish to stay until tomorrow ? ? LOS: 3 days  ? ?05/18/22 ?03/18/2022, 10:19 AM  ? ? ?

## 2022-03-18 NOTE — Discharge Summary (Signed)
? ?  Postpartum Discharge Summary ? ? ? ?   ?Patient Name: Jessica Jordan ?DOB: 1996-07-25 ?MRN: [id number] ? ?Date of admission: 03/15/2022 ?Delivery date:03/17/2022  ?Delivering provider: Willis Modena, TODD  ?Date of discharge: 03/18/2022 ? ?Admitting diagnosis: Full-term premature rupture of membranes (PROM) with unknown onset of labor [O42.90] ?Intrauterine pregnancy: [redacted]w[redacted]d     ?Secondary diagnosis:  Principal Problem: ?  Full-term premature rupture of membranes (PROM) with unknown onset of labor ? ?Additional problems: none    ?Discharge diagnosis: Term Pregnancy Delivered                                              ?Post partum procedures: none ?Augmentation: Pitocin ?Complications: None ? ?Hospital course: Onset of Labor With Vaginal Delivery      ?26 y.o. yo G1P1001 at [redacted]w[redacted]d was admitted in Latent Labor on 03/15/2022. Patient had an uncomplicated labor course as follows:  ?Membrane Rupture Time/Date:  ,   ?Delivery Method:Vaginal, Spontaneous  ?Episiotomy: None  ?Lacerations:  1st degree  ?Patient had an uncomplicated postpartum course.  She is ambulating, tolerating a regular diet, passing flatus, and urinating well. Patient is discharged home in stable condition on 03/18/22. ? ?Newborn Data: ?Birth date:03/17/2022  ?Birth time:5:03 AM  ?Gender:Female  ?Living status:Living  ?Apgars:9 ,9  ?Weight:2550 g  ? ? ? ?Physical exam  ?Vitals:  ? 03/17/22 1233 03/17/22 1756 03/17/22 2030 03/18/22 0549  ?BP: 126/82 130/75 113/77 118/73  ?Pulse: 70 75 89 66  ?Resp: 18 18 17 18   ?Temp: 98.2 ?F (36.8 ?C) 97.9 ?F (36.6 ?C) 98.6 ?F (37 ?C) 97.9 ?F (36.6 ?C)  ?TempSrc: Oral Oral Oral Oral  ?SpO2:   98% 100%  ?Weight:      ?Height:      ? ?General: alert and cooperative ?Lochia: appropriate ?Uterine Fundus: firm ? ?Labs: ?Lab Results  ?Component Value Date  ? WBC 13.6 (H) 03/18/2022  ? HGB 10.3 (L) 03/18/2022  ? HCT 31.7 (L) 03/18/2022  ? MCV 90.6 03/18/2022  ? PLT 214 03/18/2022  ? ?   ? View : No data to display.  ?  ?  ?   ? ?Edinburgh Score: ? ?  03/17/2022  ?  8:00 AM  ?Flavia Shipper Postnatal Depression Scale Screening Tool  ?I have been able to laugh and see the funny side of things. 1  ?I have looked forward with enjoyment to things. 2  ?I have blamed myself unnecessarily when things went wrong. 2  ?I have been anxious or worried for no good reason. 3  ?I have felt scared or panicky for no good reason. 2  ?Things have been getting on top of me. 2  ?I have been so unhappy that I have had difficulty sleeping. 2  ?I have felt sad or miserable. 2  ?I have been so unhappy that I have been crying. 1  ?The thought of harming myself has occurred to me. 0  ?Edinburgh Postnatal Depression Scale Total 17  ? ? ? ?After visit meds:  ?Allergies as of 03/18/2022   ?No Known Allergies ?  ? ?  ?Medication List  ?  ? ?TAKE these medications   ? ?acetaminophen 325 MG tablet ?Commonly known as: Tylenol ?Take 2 tablets (650 mg total) by mouth every 4 (four) hours as needed (for pain scale < 4). ?  ?ibuprofen 600 MG tablet ?Commonly known  as: ADVIL ?Take 1 tablet (600 mg total) by mouth every 6 (six) hours. ?  ? ?  ? ? ? ?Discharge home in stable condition ?Infant Feeding: Bottle ?Infant Disposition:home with mother ?Discharge instruction: per After Visit Summary and Postpartum booklet. ?Activity: Advance as tolerated. Pelvic rest for 6 weeks.  ?Diet: routine diet ?Future Appointments:No future appointments. ?Follow up Visit: ? Follow-up Information   ? ? Meisinger, Todd, MD. Schedule an appointment as soon as possible for a visit in 6 week(s).   ?Specialty: Obstetrics and Gynecology ?Why: Routine pp f/u ?Contact information: ?[address], [address] ?[place] [address] ?[phone] ? ? ?  ?  ? ?  ?  ? ?  ? ? ? ?Please schedule this patient for a In person postpartum visit in 6 weeks with the following provider: MD. ? ?Delivery mode:  Vaginal, Spontaneous  ?Anticipated Birth Control:  Unsure ? ? ?03/18/2022 ?Logan Bores, MD ? ? ? ?

## 2022-03-19 NOTE — Progress Notes (Signed)
Mom and infant escorted off [facility] with nursing staff for discharge.  ?

## 2022-03-26 ENCOUNTER — Telehealth (HOSPITAL_COMMUNITY): Payer: Self-pay | Admitting: *Deleted

## 2022-03-26 NOTE — Telephone Encounter (Signed)
Left phone voicemail message.  Duffy Rhody, RN 03-26-2022 at 12:54pm

## 2022-03-26 NOTE — Telephone Encounter (Signed)
Mom reports feeling good physically. Emotionally feeling down. No other concerns about herself at this time. EPDS=13 [facility] score=17) Dr. Terri Piedra notified of scores via fax. Pt reports she is not taking meds or therapy currently, but usually goes through the [place] for those concerns.  Mom reports baby is doing well. Feeding, peeing, and pooping without difficulty. Safe sleep reviewed. Mom reports no concerns about baby at present.  Odis Hollingshead, RN 03-26-2022 at 1:50pm

## 2022-03-31 ENCOUNTER — Inpatient Hospital Stay (HOSPITAL_COMMUNITY): Admission: AD | Admit: 2022-03-31 | Payer: No Typology Code available for payment source | Source: Home / Self Care
